# Patient Record
Sex: Male | Born: 1990 | Race: Black or African American | Hispanic: No | Marital: Married | Smoking: Never smoker
Health system: Southern US, Community
[De-identification: ages and names within clinical notes are randomized; demographics above are authoritative.]

---

## 2020-02-06 ENCOUNTER — Other Ambulatory Visit: Payer: Self-pay

## 2020-02-06 DIAGNOSIS — Z20822 Contact with and (suspected) exposure to covid-19: Secondary | ICD-10-CM

## 2020-02-11 LAB — NOVEL CORONAVIRUS, NAA: SARS-CoV-2, NAA: DETECTED — AB

## 2020-09-26 ENCOUNTER — Encounter (HOSPITAL_BASED_OUTPATIENT_CLINIC_OR_DEPARTMENT_OTHER): Payer: Self-pay

## 2020-09-26 ENCOUNTER — Emergency Department (HOSPITAL_BASED_OUTPATIENT_CLINIC_OR_DEPARTMENT_OTHER)
Admission: EM | Admit: 2020-09-26 | Discharge: 2020-09-26 | Disposition: A | Discharge status: Home / Self Care | Payer: BC Managed Care – PPO | Attending: Emergency Medicine | Admitting: Emergency Medicine

## 2020-09-26 ENCOUNTER — Other Ambulatory Visit: Payer: Self-pay

## 2020-09-26 ENCOUNTER — Emergency Department (HOSPITAL_BASED_OUTPATIENT_CLINIC_OR_DEPARTMENT_OTHER): Payer: BC Managed Care – PPO

## 2020-09-26 DIAGNOSIS — R0602 Shortness of breath: Secondary | ICD-10-CM | POA: Insufficient documentation

## 2020-09-26 DIAGNOSIS — R0789 Other chest pain: Secondary | ICD-10-CM | POA: Insufficient documentation

## 2020-09-26 DIAGNOSIS — Z7982 Long term (current) use of aspirin: Secondary | ICD-10-CM | POA: Diagnosis not present

## 2020-09-26 DIAGNOSIS — R091 Pleurisy: Secondary | ICD-10-CM | POA: Diagnosis not present

## 2020-09-26 DIAGNOSIS — Z20822 Contact with and (suspected) exposure to covid-19: Secondary | ICD-10-CM | POA: Diagnosis not present

## 2020-09-26 DIAGNOSIS — R059 Cough, unspecified: Secondary | ICD-10-CM | POA: Insufficient documentation

## 2020-09-26 DIAGNOSIS — R071 Chest pain on breathing: Secondary | ICD-10-CM

## 2020-09-26 DIAGNOSIS — M25512 Pain in left shoulder: Secondary | ICD-10-CM | POA: Insufficient documentation

## 2020-09-26 LAB — BASIC METABOLIC PANEL
Anion gap: 10 (ref 5–15)
BUN: 19 mg/dL (ref 6–20)
CO2: 27 mmol/L (ref 22–32)
Calcium: 9.7 mg/dL (ref 8.9–10.3)
Chloride: 104 mmol/L (ref 98–111)
Creatinine, Ser: 1.3 mg/dL — ABNORMAL HIGH (ref 0.61–1.24)
GFR, Estimated: >60 mL/min (ref >60)
Glucose, Bld: 99 mg/dL (ref 70–99)
Potassium: 3.9 mmol/L (ref 3.5–5.1)
Sodium: 141 mmol/L (ref 135–145)

## 2020-09-26 LAB — CBC
HCT: 44.6 % (ref 39.0–52.0)
Hemoglobin: 14.6 g/dL (ref 13.0–17.0)
MCH: 29.4 pg (ref 26.0–34.0)
MCHC: 32.7 g/dL (ref 30.0–36.0)
MCV: 89.9 fL (ref 80.0–100.0)
Platelets: 291 10*3/uL (ref 150–400)
RBC: 4.96 MIL/uL (ref 4.22–5.81)
RDW: 12.4 % (ref 11.5–15.5)
WBC: 11.6 10*3/uL — ABNORMAL HIGH (ref 4.0–10.5)
nRBC: 0 % (ref 0.0–0.2)

## 2020-09-26 LAB — RESP PANEL BY RT-PCR (FLU A&B, COVID) ARPGX2
Influenza A by PCR: NEGATIVE
Influenza B by PCR: NEGATIVE
SARS Coronavirus 2 by RT PCR: NEGATIVE

## 2020-09-26 LAB — D-DIMER, QUANTITATIVE: D-Dimer, Quant: 0.37 ug/mL-FEU (ref 0.00–0.50)

## 2020-09-26 LAB — TROPONIN I (HIGH SENSITIVITY)
Troponin I (High Sensitivity): 9 ng/L (ref <18)
Troponin I (High Sensitivity): 8 ng/L (ref <18)

## 2020-09-26 MED ORDER — ASPIRIN EC 325 MG PO TBEC
325.0000 mg | DELAYED_RELEASE_TABLET | Freq: Once | ORAL | Status: AC
  Administered 2020-09-26: 325 mg via ORAL
  Filled 2020-09-26: qty 1

## 2020-09-26 NOTE — ED Provider Notes (Signed)
[facility] EMERGENCY DEPARTMENT Provider Note   CSN: [id number] Arrival date & time: 09/26/20  1014     History Chief Complaint  Patient presents with   Chest Pain    Ray James is a 30 y.o. male.   Chest Pain Pain location:  L chest Pain quality: aching and sharp   Pain radiates to:  L shoulder Pain severity:  Moderate Duration:  3 days Timing:  Intermittent Progression:  Resolved Chronicity:  New Context: breathing and at rest   Relieved by:  None tried Worsened by:  Deep breathing Ineffective treatments:  None tried Associated symptoms: cough and shortness of breath   Associated symptoms: no abdominal pain, no altered mental status, no anxiety, no back pain, no fatigue, no fever, no headache, no heartburn, no lower extremity edema, no nausea, no numbness, no palpitations, no syncope, no vomiting and no weakness   Risk factors: male sex   Risk factors: no coronary artery disease, no diabetes mellitus, no high cholesterol, no hypertension, no prior DVT/PE and no smoking    HPI: A 30 year old patient presents for evaluation of chest pain. Initial onset of pain was approximately 1-3 hours ago. The patient's chest pain is described as heaviness/pressure/tightness, is sharp and is not worse with exertion. The patient's chest pain is middle- or left-sided, is not well-localized and does radiate to the arms/jaw/neck. The patient does not complain of nausea and denies diaphoresis. The patient has no history of stroke, has no history of peripheral artery disease, has not smoked in the past 90 days, denies any history of treated diabetes, has no relevant family history of coronary artery disease (first degree relative at less than age 57), is not hypertensive, has no history of hypercholesterolemia and does not have an elevated BMI (>=30).   History reviewed. No pertinent past medical history.  There are no problems to display for this patient.   History reviewed. No  pertinent surgical history.     History reviewed. No pertinent family history.  Social History   Tobacco Use   Smoking status: Never   Smokeless tobacco: Never  Vaping Use   Vaping Use: Former  Substance Use Topics   Alcohol use: Yes    Comment: occasional   Drug use: Never    Home Medications Prior to Admission medications   Medication Sig Start Date End Date Taking? Authorizing Provider  modafinil (PROVIGIL) 200 MG tablet Take 200 mg by mouth daily. 09/15/20   [provider]  traZODone (DESYREL) 50 MG tablet Take 50 mg by mouth at bedtime. 08/23/20   [provider]    Allergies    Patient has no known allergies.  Review of Systems   Review of Systems  Constitutional:  Negative for chills, fatigue and fever.  HENT:  Negative for ear pain and sore throat.   Eyes:  Negative for pain and visual disturbance.  Respiratory:  Positive for cough and shortness of breath.   Cardiovascular:  Positive for chest pain. Negative for palpitations and syncope.  Gastrointestinal:  Negative for abdominal pain, heartburn, nausea and vomiting.  Genitourinary:  Negative for dysuria and hematuria.  Musculoskeletal:  Negative for arthralgias and back pain.  Skin:  Negative for color change and rash.  Neurological:  Negative for seizures, syncope, weakness, numbness and headaches.  All other systems reviewed and are negative.  Physical Exam Updated Vital Signs BP 122/81   Pulse 82   Temp 99.3 F (37.4 C) (Oral)   Resp 18  Ht 5\' 9"  (1.753 m)   Wt 116.6 kg   SpO2 99%   BMI 37.95 kg/m   Physical Exam Vitals and nursing note reviewed.  Constitutional:      Appearance: He is well-developed.  HENT:     Head: Normocephalic and atraumatic.  Eyes:     Conjunctiva/sclera: Conjunctivae normal.  Cardiovascular:     Rate and Rhythm: Normal rate and regular rhythm.     Pulses: Normal pulses.     Heart sounds: No murmur heard. Pulmonary:     Effort: Pulmonary effort  is normal. No respiratory distress.     Breath sounds: Normal breath sounds.  Chest:     Chest wall: No tenderness.  Abdominal:     Palpations: Abdomen is soft.     Tenderness: There is no abdominal tenderness.  Musculoskeletal:     Cervical back: Neck supple.     Right lower leg: No edema.     Left lower leg: No edema.  Skin:    General: Skin is warm and dry.  Neurological:     General: No focal deficit present.     Mental Status: He is alert. Mental status is at baseline.    ED Results / Procedures / Treatments   Labs (all labs ordered are listed, but only abnormal results are displayed) Labs Reviewed  BASIC METABOLIC PANEL - Abnormal; Notable for the following components:      Result Value   Creatinine, Ser 1.30 (*)    All other components within normal limits  CBC - Abnormal; Notable for the following components:   WBC 11.6 (*)    All other components within normal limits  RESP PANEL BY RT-PCR (FLU A&B, COVID) [id number]  D-DIMER, QUANTITATIVE  TROPONIN I (HIGH SENSITIVITY)  TROPONIN I (HIGH SENSITIVITY)    EKG EKG Interpretation  Date/Time:  Monday September 26 2020 10:30:43 EDT Ventricular Rate:  95 PR Interval:  130 QRS Duration: 80 QT Interval:  342 QTC Calculation: 429 R Axis:   88 Text Interpretation: Normal sinus rhythm Normal ECG Confirmed by [email or web address] ([id number]) on 09/26/2020 10:33:48 AM  Radiology DG Chest 2 View  Result Date: 09/26/2020 CLINICAL DATA:  30 year old male with history of chest pain and shortness of breath. EXAM: CHEST - 2 VIEW COMPARISON:  No priors. FINDINGS: Lung volumes are normal. No consolidative airspace disease. No pleural effusions. No pneumothorax. No pulmonary nodule or mass noted. Pulmonary vasculature and the cardiomediastinal silhouette are within normal limits. IMPRESSION: No radiographic evidence of acute cardiopulmonary disease. Electronically Signed   By: 32 M.D.   On: 09/26/2020 11:32    Procedures Procedures    Medications Ordered in ED Medications  aspirin EC tablet 325 mg (325 mg Oral Given 09/26/20 1116)    ED Course  I have reviewed the triage vital signs and the nursing notes.  Pertinent labs & imaging results that were available during my care of the patient were reviewed by me and considered in my medical decision making (see chart for details).    MDM Rules/Calculators/A&P HEAR Score: 1                         Ray James is a 30 y.o. male who presented to the Emergency Department c/o chest pain. Past medical records have been reviewed and are notable for smoking history, negative history of DVT/PE, no history of hypertension, hyperlipidemia, CVA, family history of sudden cardiac death or arrhythmia.  Pertinent exam findings include: Lungs clear to auscultation bilaterally, no chest wall tenderness, heart sounds normal, no lower extremity edema.  EKG: Normal sinus rhythm with a rate of 95 and no evidence of acute ischemic changes, abnormal intervals, or dysrhythmia. No priors for comparison.   Lab results include: Troponins x2 negative, D-dimer normal, COVID-19 and influenza PCR normal, CBC with a mild leukocytosis to 11.6, no anemia or platelet abnormality, BMP generally unremarkable with a mildly elevated serum creatinine of 1.3 with no prior labs for comparison.  Imaging results include: Chest x-ray with no acute cardiac or pulmonary normality, no evidence of pneumothorax or focal consolidation to suggest airspace disease.  Course of tx has consisted of: Aspirin  Thought process: 30 year old male presenting with no significant past medical history with intermittent sharp, left-sided chest pain with associated shortness of breath.  No fevers or chills.  Last had COVID in January.  No sick contacts.  Mostly sharp and left-sided radiating to the left shoulder, worse with deep breathing however some squeezing component intermittently.  Differential diagnosis includes: ACS,  pneumonia, pneumothorax, pulmonary embolism,pericarditis/myocarditis, GERD, PUD, musculoskeletal, COVID-19, viral URI.  HEART score of 1, low risk. Patient given ASA 325 mg, not given nitroglycerin.  Well's score, low probability. D-dimer negative. Labs generally unremarkable.  Unlikely pneumonia, no cough, no leukocytosis, no fevers, CXR and exam without acute findings. Unlikely pneumothorax, no findings on  CXR. Unlikely pericarditis/myocarditis, does not fit clinical picture. Chest pain not significantly exertional. Unlikely dissection, no pulse deficit, no tearing chest pain, no neurologic complaints. Due to patient's high risk will rule out ACS.  Delta troponins resulted negative.  Low concern for ACS at this time.  D-dimer normal with no evidence for PE.  No evidence of pneumothorax on chest x-ray.   Patient's clinical presentation is most consistent with pleurisy.  Final Clinical Impression(s) / ED Diagnoses Final diagnoses:  Pleurisy  Chest pain on breathing    Rx / DC Orders ED Discharge Orders     None        Ernie Avena, MD 09/26/20 1321

## 2020-09-26 NOTE — ED Triage Notes (Addendum)
Pt c/o left sided chest pain intermittently x 3 days. States pain stays for appx 45 mins at a time. Gets SOB when pain hits. States pain is sharp, worsens with deep breath.

## 2020-09-26 NOTE — Discharge Instructions (Addendum)
Your troponins (cardiac enzymes) were normal making heart attack unlikely.  Your D-dimer which test for blood clots in your lungs was also normal.  Your chest x-ray was reassuring with no acute abnormalities.  Your symptoms are most consistent with pleurisy which is inflammation of the lining of your lungs.  Recommend course of NSAIDs such as ibuprofen for pain control over the next few days and follow-up with your PCP for reassessment.

## 2022-08-31 IMAGING — DX DG CHEST 2V
2 series · 2 of 2 positions shown · non-contrast
Comparison: No priors.

CLINICAL DATA: 30-year-old male with history of chest pain and
shortness of breath.

EXAM:
CHEST - 2 VIEW

[chest pa]
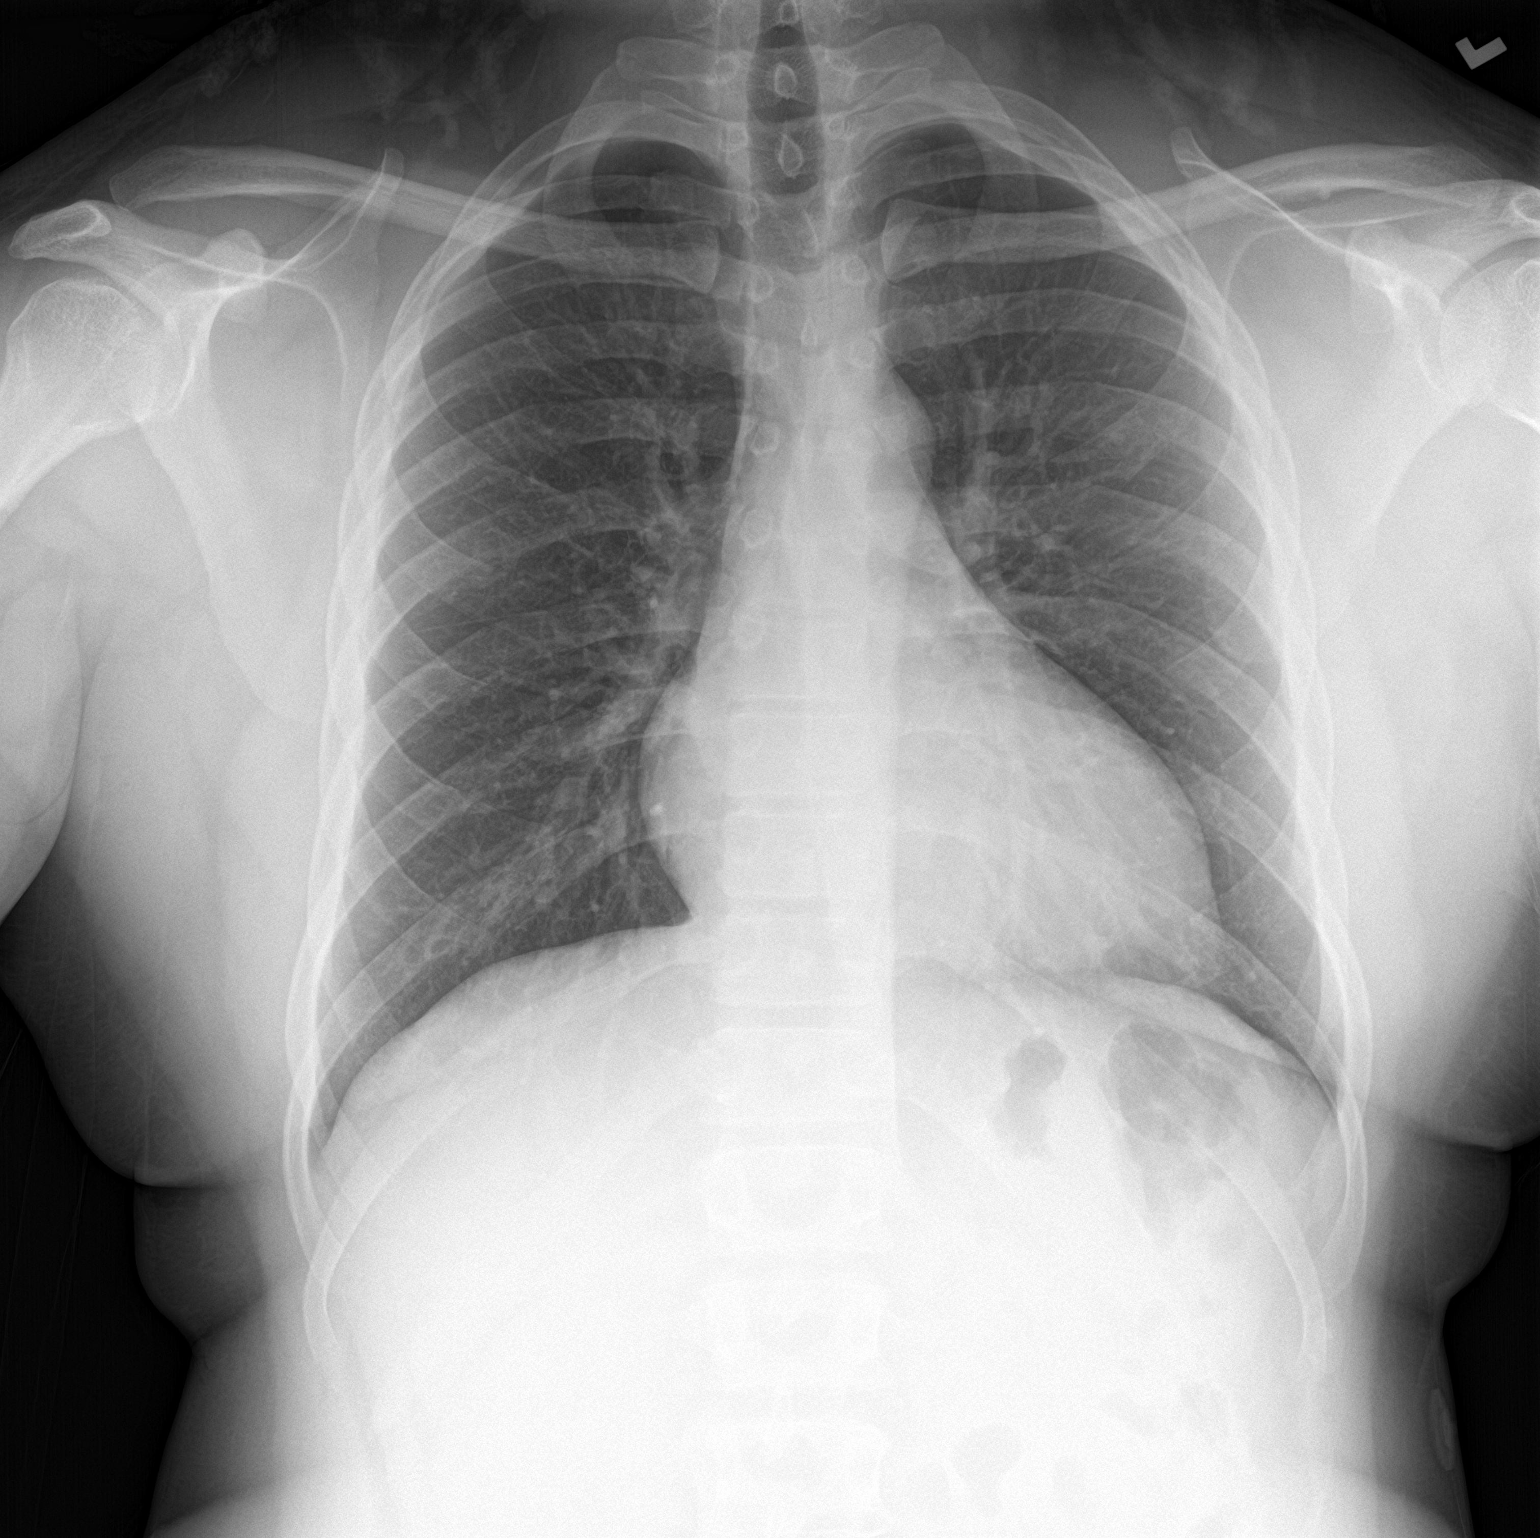

[chest lat]
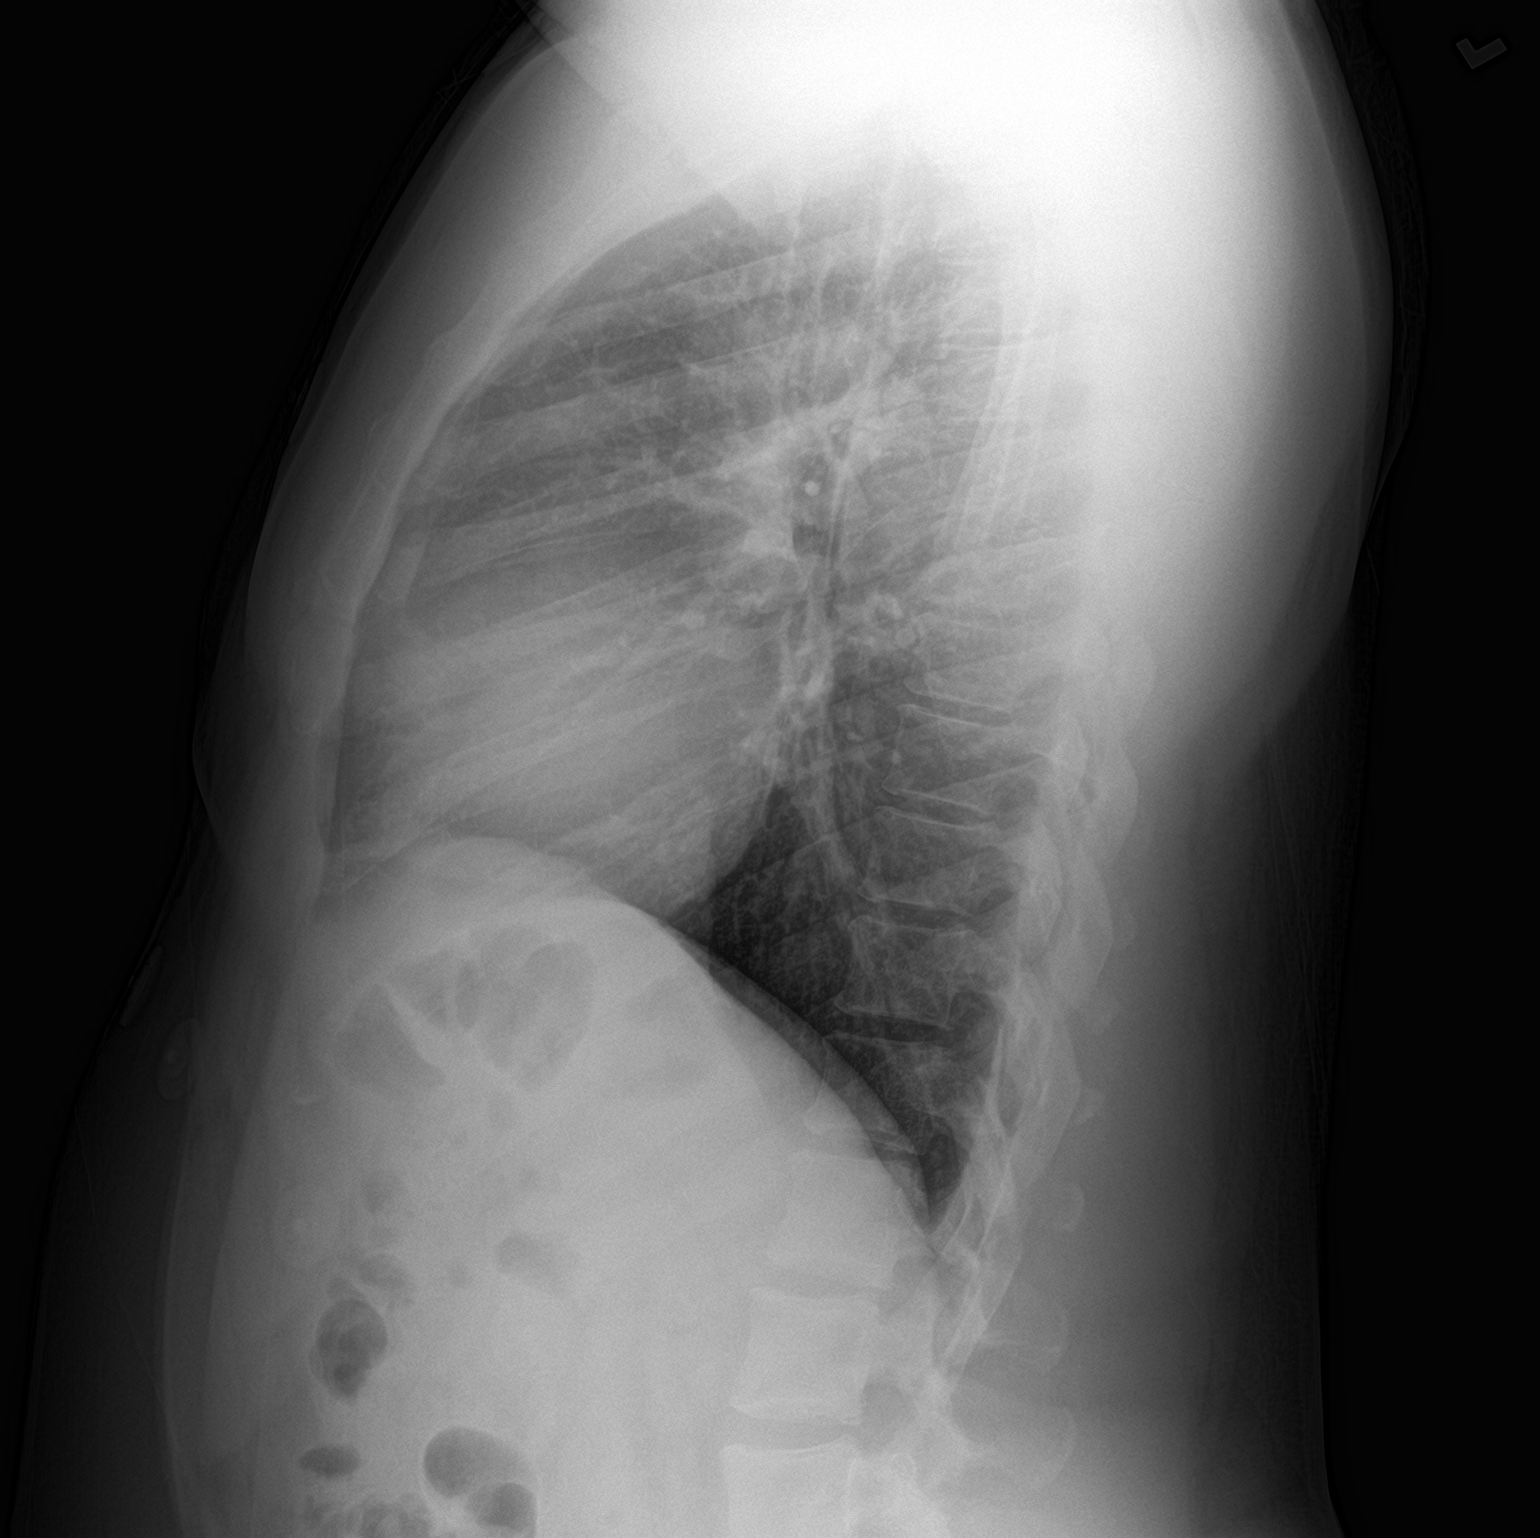

[2 of 2 positions shown; findings below may reference images not displayed]

FINDINGS: Lung volumes are normal. No consolidative airspace disease. No
pleural effusions. No pneumothorax. No pulmonary nodule or mass
noted. Pulmonary vasculature and the cardiomediastinal silhouette
are within normal limits.
IMPRESSION: No radiographic evidence of acute cardiopulmonary disease.
